# Patient Record
Sex: Female | Born: 1980 | Race: White | Hispanic: No | Marital: Married | Smoking: Never smoker
Health system: Southern US, Community
[De-identification: ages and names within clinical notes are randomized; demographics above are authoritative.]

## PROBLEM LIST (undated history)

## (undated) DIAGNOSIS — O1490 Unspecified pre-eclampsia, unspecified trimester: Secondary | ICD-10-CM

## (undated) DIAGNOSIS — N979 Female infertility, unspecified: Secondary | ICD-10-CM

## (undated) DIAGNOSIS — G43909 Migraine, unspecified, not intractable, without status migrainosus: Secondary | ICD-10-CM

## (undated) DIAGNOSIS — D649 Anemia, unspecified: Secondary | ICD-10-CM

## (undated) HISTORY — DX: Anemia, unspecified: D64.9

## (undated) HISTORY — DX: Unspecified pre-eclampsia, unspecified trimester: O14.90

## (undated) HISTORY — DX: Female infertility, unspecified: N97.9

## (undated) HISTORY — PX: OTHER SURGICAL HISTORY: SHX169

## (undated) HISTORY — DX: Migraine, unspecified, not intractable, without status migrainosus: G43.909

---

## 2012-10-22 DIAGNOSIS — O0901 Supervision of pregnancy with history of infertility, first trimester: Secondary | ICD-10-CM | POA: Insufficient documentation

## 2013-04-08 DIAGNOSIS — O139 Gestational [pregnancy-induced] hypertension without significant proteinuria, unspecified trimester: Secondary | ICD-10-CM | POA: Insufficient documentation

## 2013-08-21 DIAGNOSIS — L210 Seborrhea capitis: Secondary | ICD-10-CM | POA: Insufficient documentation

## 2014-05-13 DIAGNOSIS — Z0110 Encounter for examination of ears and hearing without abnormal findings: Secondary | ICD-10-CM | POA: Insufficient documentation

## 2016-08-28 ENCOUNTER — Ambulatory Visit (HOSPITAL_COMMUNITY)
Admission: EM | Admit: 2016-08-28 | Discharge: 2016-08-28 | Disposition: A | Discharge status: Home / Self Care | Payer: BLUE CROSS/BLUE SHIELD

## 2016-08-28 ENCOUNTER — Encounter (HOSPITAL_COMMUNITY): Payer: Self-pay | Admitting: Emergency Medicine

## 2016-08-28 NOTE — ED Triage Notes (Signed)
Left , middle finger pain.  No known injury.  Accustomed to feeling some, intermittent pain, but today it was different.  Finger is in position of function, if attempted to straighten or flex tightly, finger hurts significantly

## 2016-08-28 NOTE — ED Notes (Signed)
Pt had spontaneous resolution of finger pain/stiffness.  States she will f/u with her PCP instead of being evaluated here since it resolved.

## 2016-08-28 NOTE — ED Notes (Signed)
Bed: UC07 Expected date:  Expected time:  Means of arrival:  Comments: 

## 2016-08-28 NOTE — ED Notes (Signed)
While in triage room, suddenly able to move left middle finger.  This canceled xray and patient has questions as to what happened

## 2017-05-17 ENCOUNTER — Other Ambulatory Visit: Payer: Self-pay

## 2017-05-17 ENCOUNTER — Encounter (HOSPITAL_COMMUNITY): Payer: Self-pay | Admitting: Emergency Medicine

## 2017-05-17 ENCOUNTER — Ambulatory Visit (HOSPITAL_COMMUNITY)
Admission: EM | Admit: 2017-05-17 | Discharge: 2017-05-17 | Disposition: A | Discharge status: Home / Self Care | Payer: BLUE CROSS/BLUE SHIELD | Attending: Urgent Care | Admitting: Urgent Care

## 2017-05-17 DIAGNOSIS — R112 Nausea with vomiting, unspecified: Secondary | ICD-10-CM | POA: Diagnosis not present

## 2017-05-17 DIAGNOSIS — R197 Diarrhea, unspecified: Secondary | ICD-10-CM

## 2017-05-17 DIAGNOSIS — K529 Noninfective gastroenteritis and colitis, unspecified: Secondary | ICD-10-CM

## 2017-05-17 MED ORDER — PROMETHAZINE HCL 25 MG RE SUPP: 25.0000 mg | Freq: Four times a day (QID) | RECTAL | 0 refills | Status: DC | PRN

## 2017-05-17 MED ORDER — ONDANSETRON 8 MG PO TBDP: 8.0000 mg | ORAL_TABLET | Freq: Three times a day (TID) | ORAL | 0 refills | Status: DC | PRN

## 2017-05-17 NOTE — ED Triage Notes (Signed)
Patient reports vomiting and diarrhea for 2 days, low grade temperature, headache, and overall feeling tired.    Child was recently sick with vomiting and diarrhea

## 2017-05-17 NOTE — ED Provider Notes (Signed)
  MRN: [id number] DOB: 1980-06-28  Subjective:   Toni Lara is a 37 y.o. female presenting for 2-day history of nausea with vomiting, diarrhea, subjective fever, fatigue.  Her bowel movements have slowed down, still has nausea and vomiting, yesterday could not tolerate fluids.  She also has mild general belly discomfort.  Her son had similar symptoms prior to patient getting sick and he is now improved.  Patient has previously tried both Phenergan and Zofran.  Phenergan worked better for patient.  She is not currently taking any medications.    Allergies  Allergen Reactions  . Penicillins     Denies past medical history past surgical history.  Objective:   Vitals: BP 127/85 (BP Location: Right Arm)   Pulse 76   Temp 98.4 F (36.9 C) (Oral)   Resp 18   LMP 05/11/2017   SpO2 96%   Physical Exam  Constitutional: She is oriented to person, place, and time. She appears well-developed and well-nourished.  HENT:  Mouth/Throat: Oropharynx is clear and moist.  Cardiovascular: Normal rate, regular rhythm and intact distal pulses. Exam reveals no gallop and no friction rub.  No murmur heard. Pulmonary/Chest: No respiratory distress. She has no wheezes. She has no rales.  Abdominal: Soft. Bowel sounds are normal. She exhibits no distension and no mass. There is no tenderness. There is no rebound and no guarding.  Musculoskeletal: She exhibits no edema.  Neurological: She is alert and oriented to person, place, and time.  Skin: Skin is warm and dry. No rash noted. No erythema. No pallor.  Psychiatric: She has a normal mood and affect.    Assessment and Plan :   Nausea vomiting and diarrhea  Gastroenteritis  Vital signs are very reassuring.  Offered patient both prescription for Zofran and Phenergan.  Patient is to continue her hydration, eat light meals. Return-to-clinic precautions discussed, patient verbalized understanding.    Wallis BambergMani, Brentton Wardlow, PA-C 05/17/17 1203

## 2018-09-07 DIAGNOSIS — M76892 Other specified enthesopathies of left lower limb, excluding foot: Secondary | ICD-10-CM | POA: Insufficient documentation

## 2019-03-18 ENCOUNTER — Other Ambulatory Visit: Payer: Self-pay

## 2019-03-18 ENCOUNTER — Encounter: Payer: Self-pay | Admitting: Certified Nurse Midwife

## 2019-03-18 ENCOUNTER — Ambulatory Visit (INDEPENDENT_AMBULATORY_CARE_PROVIDER_SITE_OTHER): Payer: BC Managed Care – PPO | Admitting: Certified Nurse Midwife

## 2019-03-18 ENCOUNTER — Other Ambulatory Visit (HOSPITAL_COMMUNITY)
Admission: RE | Admit: 2019-03-18 | Discharge: 2019-03-18 | Disposition: A | Discharge status: Home / Self Care | Payer: BLUE CROSS/BLUE SHIELD | Source: Ambulatory Visit | Attending: Certified Nurse Midwife | Admitting: Certified Nurse Midwife

## 2019-03-18 VITALS — BP 120/78 | HR 72 | Temp 97.7°F | Resp 16 | Ht 67.25 in | Wt 144.0 lb

## 2019-03-18 DIAGNOSIS — Z01419 Encounter for gynecological examination (general) (routine) without abnormal findings: Secondary | ICD-10-CM

## 2019-03-18 DIAGNOSIS — Z124 Encounter for screening for malignant neoplasm of cervix: Secondary | ICD-10-CM

## 2019-03-18 DIAGNOSIS — G8929 Other chronic pain: Secondary | ICD-10-CM | POA: Insufficient documentation

## 2019-03-18 DIAGNOSIS — O1490 Unspecified pre-eclampsia, unspecified trimester: Secondary | ICD-10-CM | POA: Insufficient documentation

## 2019-03-18 DIAGNOSIS — H7292 Unspecified perforation of tympanic membrane, left ear: Secondary | ICD-10-CM | POA: Insufficient documentation

## 2019-03-18 NOTE — Progress Notes (Signed)
39 y.o. [id number] Married  Caucasian Fe here to establish gyn care and  for annual exam. Has not had gyn exam since 2016. Periods regular monthly,duration 2 days actual period but some spotting prior to onset. No cramping with periods. Contraception vasectomy. Took Covid vaccine in the 10 days.  Busy at home with twins and now children are in school now, with nanny assistance. Sees Urgent care if needed. Mother and maternal grandmother both had hysterectomy. Sees Dr. Renae Gloss for PCP, recent visit with labs, all normal per patient. No health issues today.  Patient's last menstrual period was 03/13/2019 (exact date).          Sexually active: Yes.    The current method of family planning is vasectomy.    Exercising: Yes.    4-5 times a week Smoker:  no  Review of Systems  Constitutional: Negative.   HENT: Negative.   Eyes: Negative.   Respiratory: Negative.   Cardiovascular: Negative.   Gastrointestinal: Negative.   Genitourinary: Negative.   Musculoskeletal: Negative.   Skin: Negative.   Neurological: Negative.   Endo/Heme/Allergies: Negative.   Psychiatric/Behavioral: Negative.     Health Maintenance: Pap:  2016 neg per patient History of Abnormal Pap: no MMG:  none Self Breast exams: no Colonoscopy:  none BMD:   none TDaP:  2015 Shingles: no Pneumonia: no Hep C and HIV: HIV neg 2014 Labs: no   reports that she has never smoked. She has never used smokeless tobacco. She reports current alcohol use of about 4.0 - 5.0 standard drinks of alcohol per week. She reports that she does not use drugs.  Past Medical History:  Diagnosis Date  . Anemia    in high school  . Infertility, female   . Migraines   . Pre-eclampsia     Past Surgical History:  Procedure Laterality Date  . arm surgery Right   . CESAREAN SECTION      No current outpatient medications on file.   No current facility-administered medications for this visit.    Family History  Problem Relation Age of  Onset  . Hypertension Mother   . Hypertension Father   . Hypertension Maternal Grandmother   . Heart attack Maternal Grandmother   . Heart disease Maternal Grandmother   . Hypertension Maternal Grandfather   . Skin cancer Paternal Grandmother   . Hypertension Paternal Grandmother   . Skin cancer Paternal Grandfather   . Hypertension Paternal Grandfather     ROS:  Pertinent items are noted in HPI.  Otherwise, a comprehensive ROS was negative.  Exam:   BP 120/78   Pulse 72   Temp 97.7 F (36.5 C) (Skin)   Resp 16   Ht 5' 7.25" (1.708 m)   Wt 144 lb (65.3 kg)   LMP 03/13/2019 (Exact Date)   BMI 22.39 kg/m  Height: 5' 7.25" (170.8 cm) Ht Readings from Last 3 Encounters:  03/18/19 5' 7.25" (1.708 m)    General appearance: alert, cooperative and appears stated age Head: Normocephalic, without obvious abnormality, atraumatic Neck: no adenopathy, supple, symmetrical, trachea midline and thyroid normal to inspection and palpation Lungs: clear to auscultation bilaterally Breasts: normal appearance, no masses or tenderness, No nipple retraction or dimpling, No nipple discharge or bleeding, No axillary or supraclavicular adenopathy Heart: regular rate and rhythm Abdomen: soft, non-tender; no masses,  no organomegaly Extremities: extremities normal, atraumatic, no cyanosis or edema, superficial varicosity on right leg, patient aware Skin: Skin color, texture, turgor normal. No rashes or lesions  Lymph nodes: Cervical, supraclavicular, and axillary nodes normal. No abnormal inguinal nodes palpated Neurologic: Grossly normal   Pelvic: External genitalia:  no lesions, normal female              Urethra:  normal appearing urethra with no masses, tenderness or lesions              Bartholin's and Skene's: normal                 Vagina: normal appearing vagina with normal color and discharge, no lesions              Cervix: multiparous appearance, no cervical motion tenderness and no  lesions              Pap taken: Yes.   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 Given information to schedule mammogram. Questions addressed.  Continue yearly exam with PCP as indicated  Printed information on varicose veins given  Pap smear: yes   counseled on breast self exam, mammography screening, feminine hygiene, adequate intake of calcium and vitamin D, diet and exercise  return annually or prn  An After Visit Summary was printed and given to the patient.

## 2019-03-18 NOTE — Patient Instructions (Addendum)
EXERCISE AND DIET:  We recommended that you start or continue a regular exercise program for good health. Regular exercise means any activity that makes your heart beat faster and makes you sweat.  We recommend exercising at least 30 minutes per day at least 3 days a week, preferably 4 or 5.  We also recommend a diet low in fat and sugar.  Inactivity, poor dietary choices and obesity can cause diabetes, heart attack, stroke, and kidney damage, among others.    ALCOHOL AND SMOKING:  Women should limit their alcohol intake to no more than 7 drinks/beers/glasses of wine (combined, not each!) per week. Moderation of alcohol intake to this level decreases your risk of breast cancer and liver damage. And of course, no recreational drugs are part of a healthy lifestyle.  And absolutely no smoking or even second hand smoke. Most people know smoking can cause heart and lung diseases, but did you know it also contributes to weakening of your bones? Aging of your skin?  Yellowing of your teeth and nails?  CALCIUM AND VITAMIN D:  Adequate intake of calcium and Vitamin D are recommended.  The recommendations for exact amounts of these supplements seem to change often, but generally speaking 600 mg of calcium (either carbonate or citrate) and 800 units of Vitamin D per day seems prudent. Certain women may benefit from higher intake of Vitamin D.  If you are among these women, your doctor will have told you during your visit.    PAP SMEARS:  Pap smears, to check for cervical cancer or precancers,  have traditionally been done yearly, although recent scientific advances have shown that most women can have pap smears less often.  However, every woman still should have a physical exam from her gynecologist every year. It will include a breast check, inspection of the vulva and vagina to check for abnormal growths or skin changes, a visual exam of the cervix, and then an exam to evaluate the size and shape of the uterus and  ovaries.  And after 40 years of age, a rectal exam is indicated to check for rectal cancers. We will also provide age appropriate advice regarding health maintenance, like when you should have certain vaccines, screening for sexually transmitted diseases, bone density testing, colonoscopy, mammograms, etc.   MAMMOGRAMS:  All women over 40 years old should have a yearly mammogram. Many facilities now offer a "3D" mammogram, which may cost around $50 extra out of pocket. If possible,  we recommend you accept the option to have the 3D mammogram performed.  It both reduces the number of women who will be called back for extra views which then turn out to be normal, and it is better than the routine mammogram at detecting truly abnormal areas.    COLONOSCOPY:  Colonoscopy to screen for colon cancer is recommended for all women at age 50.  We know, you hate the idea of the prep.  We agree, BUT, having colon cancer and not knowing it is worse!!  Colon cancer so often starts as a polyp that can be seen and removed at colonscopy, which can quite literally save your life!  And if your first colonoscopy is normal and you have no family history of colon cancer, most women don't have to have it again for 10 years.  Once every ten years, you can do something that may end up saving your life, right?  We will be happy to help you get it scheduled when you are ready.    Be sure to check your insurance coverage so you understand how much it will cost.  It may be covered as a preventative service at no cost, but you should check your particular policy.      Varicose Veins Varicose veins are veins that have become enlarged, bulged, and twisted. They most often appear in the legs. What are the causes? This condition is caused by damage to the valves in the vein. These valves help blood return to your heart. When they are damaged and they stop working properly, blood may flow backward and back up in the veins near the skin,  causing the veins to get larger and appear twisted. The condition can result from any issue that causes blood to back up, like pregnancy, prolonged standing, or obesity. What increases the risk? This condition is more likely to develop in people who are:  On their feet a lot.  Pregnant.  Overweight. What are the signs or symptoms? Symptoms of this condition include:  Bulging, twisted, and bluish veins.  A feeling of heaviness. This may be worse at the end of the day.  Leg pain. This may be worse at the end of the day.  Swelling in the leg.  Changes in skin color over the veins. How is this diagnosed? This condition may be diagnosed based on your symptoms, a physical exam, and an ultrasound test. How is this treated? Treatment for this condition may involve:  Avoiding sitting or standing in one position for long periods of time.  Wearing compression stockings. These stockings help to prevent blood clots and reduce swelling in the legs.  Raising (elevating) the legs when resting.  Losing weight.  Exercising regularly. If you have persistent symptoms or want to improve the way your varicose veins look, you may choose to have a procedure to close the varicose veins off or to remove them. Treatments to close off the veins include:  Sclerotherapy. In this treatment, a solution is injected into a vein to close it off.  Laser treatment. In this treatment, the vein is heated with a laser to close it off.  Radiofrequency vein ablation. In this treatment, an electrical current produced by radio waves is used to close off the vein. Treatments to remove the veins include:  Phlebectomy. In this treatment, the veins are removed through small incisions made over the veins.  Vein ligation and stripping. In this treatment, incisions are made over the veins. The veins are then removed after being tied (ligated) with stitches (sutures). Follow these instructions at  home: Activity  Walk as much as possible. Walking increases blood flow. This helps blood return to the heart and takes pressure off your veins. It also increases your cardiovascular strength.  Follow your health care provider's instructions about exercising.  Do not stand or sit in one position for a long period of time.  Do not sit with your legs crossed.  Rest with your legs raised during the day. General instructions   Follow any diet instructions given to you by your health care provider.  Wear compression stockings as directed by your health care provider. Do not wear other kinds of tight clothing around your legs, pelvis, or waist.  Elevate your legs at night to above the level of your heart.  If you get a cut in the skin over the varicose vein and the vein bleeds: ? Lie down with your leg raised. ? Apply firm pressure to the cut with a clean cloth until the bleeding  stops. ? Place a bandage (dressing) on the cut. Contact a health care provider if:  The skin around your varicose veins starts to break down.  You have pain, redness, tenderness, or hard swelling over a vein.  You are uncomfortable because of pain.  You get a cut in the skin over a varicose vein and it will not stop bleeding. Summary  Varicose veins are veins that have become enlarged, bulged, and twisted. They most often appear in the legs.  This condition is caused by damage to the valves in the vein. These valves help blood return to your heart.  Treatment for this condition includes frequent movements, wearing compression stockings, losing weight, and exercising regularly. In some cases, procedures are done to close off or remove the veins.  Treatment for this condition may include wearing compression stockings, elevating the legs, losing weight, and engaging in regular activity. In some cases, procedures are done to close off or remove the veins. This information is not intended to replace advice  given to you by your health care provider. Make sure you discuss any questions you have with your health care provider. Document Revised: 03/15/2018 Document Reviewed: 02/10/2016 Elsevier Patient Education  2020 [employer].

## 2019-03-20 LAB — CYTOLOGY - PAP
Comment: NEGATIVE
Diagnosis: NEGATIVE
High risk HPV: NEGATIVE

## 2019-04-19 ENCOUNTER — Encounter: Payer: Self-pay | Admitting: Certified Nurse Midwife

## 2019-12-12 ENCOUNTER — Ambulatory Visit: Payer: BLUE CROSS/BLUE SHIELD | Attending: Internal Medicine

## 2019-12-12 DIAGNOSIS — Z23 Encounter for immunization: Secondary | ICD-10-CM

## 2019-12-12 NOTE — Progress Notes (Signed)
   Covid-19 Vaccination Clinic  Name:  Toni Lara    MRN: [id number] DOB: 02/16/1980  12/12/2019  Toni Lara was observed post Covid-19 immunization for 30 minutes based on pre-vaccination screening without incident. She was provided with Vaccine Information Sheet and instruction to access the V-Safe system.   Toni Lara was instructed to call 911 with any severe reactions post vaccine: Marland Kitchen Difficulty breathing  . Swelling of face and throat  . A fast heartbeat  . A bad rash all over body  . Dizziness and weakness

## 2020-01-03 DIAGNOSIS — Z1231 Encounter for screening mammogram for malignant neoplasm of breast: Secondary | ICD-10-CM

## 2020-01-06 ENCOUNTER — Other Ambulatory Visit: Payer: Self-pay | Admitting: Certified Nurse Midwife

## 2020-01-06 DIAGNOSIS — Z1231 Encounter for screening mammogram for malignant neoplasm of breast: Secondary | ICD-10-CM

## 2020-01-30 ENCOUNTER — Ambulatory Visit
Admission: RE | Admit: 2020-01-30 | Discharge: 2020-01-30 | Disposition: A | Discharge status: Home / Self Care | Payer: BC Managed Care – PPO | Source: Ambulatory Visit

## 2020-01-30 ENCOUNTER — Other Ambulatory Visit: Payer: Self-pay

## 2020-01-30 DIAGNOSIS — Z1231 Encounter for screening mammogram for malignant neoplasm of breast: Secondary | ICD-10-CM

## 2020-02-04 ENCOUNTER — Other Ambulatory Visit: Payer: Self-pay | Admitting: Obstetrics and Gynecology

## 2020-02-04 ENCOUNTER — Other Ambulatory Visit: Payer: Self-pay | Admitting: Cardiology

## 2020-02-04 DIAGNOSIS — R928 Other abnormal and inconclusive findings on diagnostic imaging of breast: Secondary | ICD-10-CM

## 2020-02-05 ENCOUNTER — Ambulatory Visit: Payer: Self-pay | Admitting: Internal Medicine

## 2020-02-14 ENCOUNTER — Ambulatory Visit
Admission: RE | Admit: 2020-02-14 | Discharge: 2020-02-14 | Disposition: A | Discharge status: Home / Self Care | Payer: BC Managed Care – PPO | Source: Ambulatory Visit | Attending: Obstetrics and Gynecology | Admitting: Obstetrics and Gynecology

## 2020-02-14 ENCOUNTER — Other Ambulatory Visit: Payer: Self-pay | Admitting: Obstetrics and Gynecology

## 2020-02-14 ENCOUNTER — Other Ambulatory Visit: Payer: Self-pay

## 2020-02-14 DIAGNOSIS — R928 Other abnormal and inconclusive findings on diagnostic imaging of breast: Secondary | ICD-10-CM

## 2020-02-17 ENCOUNTER — Ambulatory Visit: Payer: Self-pay | Admitting: Internal Medicine

## 2020-02-28 ENCOUNTER — Inpatient Hospital Stay: Admission: RE | Admit: 2020-02-28 | Payer: BC Managed Care – PPO | Source: Ambulatory Visit

## 2020-03-10 ENCOUNTER — Other Ambulatory Visit: Payer: Self-pay

## 2020-03-10 ENCOUNTER — Ambulatory Visit
Admission: RE | Admit: 2020-03-10 | Discharge: 2020-03-10 | Disposition: A | Discharge status: Home / Self Care | Payer: BC Managed Care – PPO | Source: Ambulatory Visit | Attending: Obstetrics and Gynecology | Admitting: Obstetrics and Gynecology

## 2020-03-10 ENCOUNTER — Other Ambulatory Visit (HOSPITAL_COMMUNITY): Payer: Self-pay | Admitting: Diagnostic Radiology

## 2020-03-10 DIAGNOSIS — R928 Other abnormal and inconclusive findings on diagnostic imaging of breast: Secondary | ICD-10-CM

## 2020-03-18 ENCOUNTER — Ambulatory Visit: Payer: BC Managed Care – PPO | Admitting: Certified Nurse Midwife

## 2022-07-19 IMAGING — MG MM BREAST LOCALIZATION CLIP
4 series · 4 of 12 positions shown · non-contrast
Comparison: Previous exam(s).

CLINICAL DATA: Evaluate COIL clip placement following stereotactic
guided biopsy of OUTER LEFT breast distortion.

EXAM:
DIAGNOSTIC LEFT MAMMOGRAM POST STEREOTACTIC BIOPSY

[L CC synth-2D]
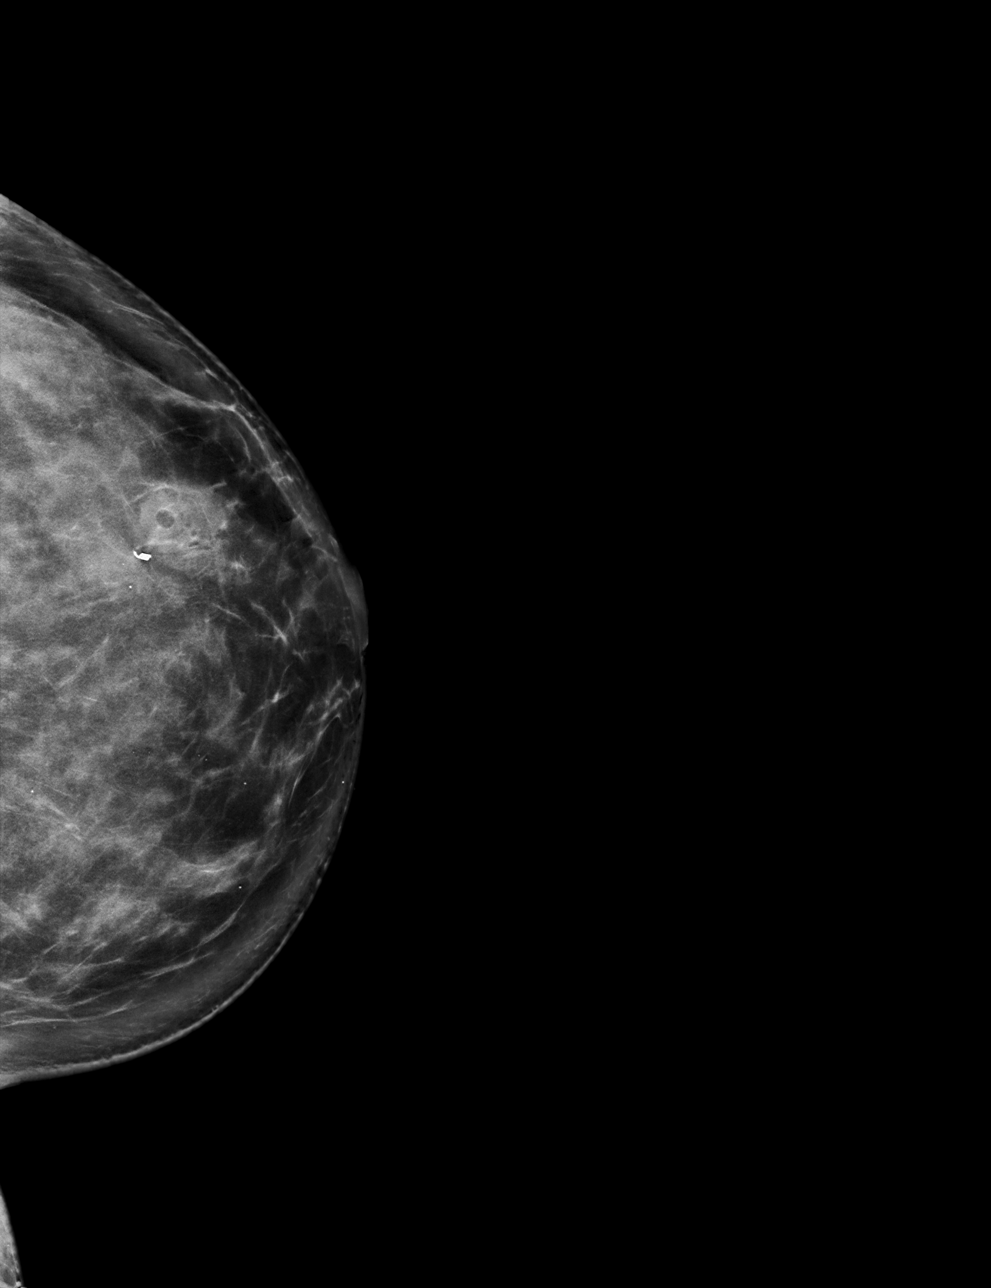

[L ML synth-2D]
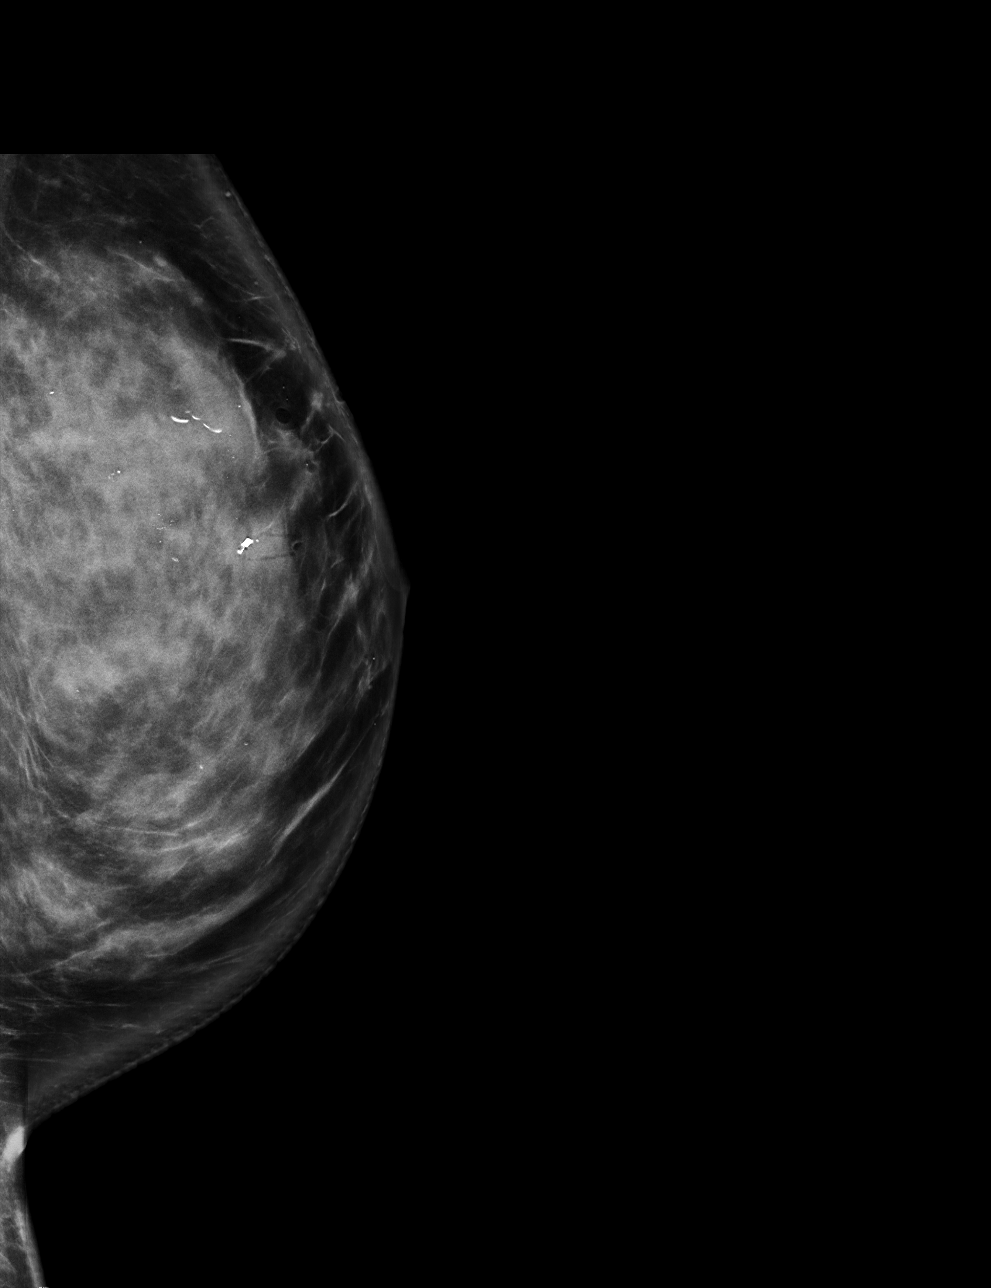

[L CC tomo · tomo slice 43/86.0]
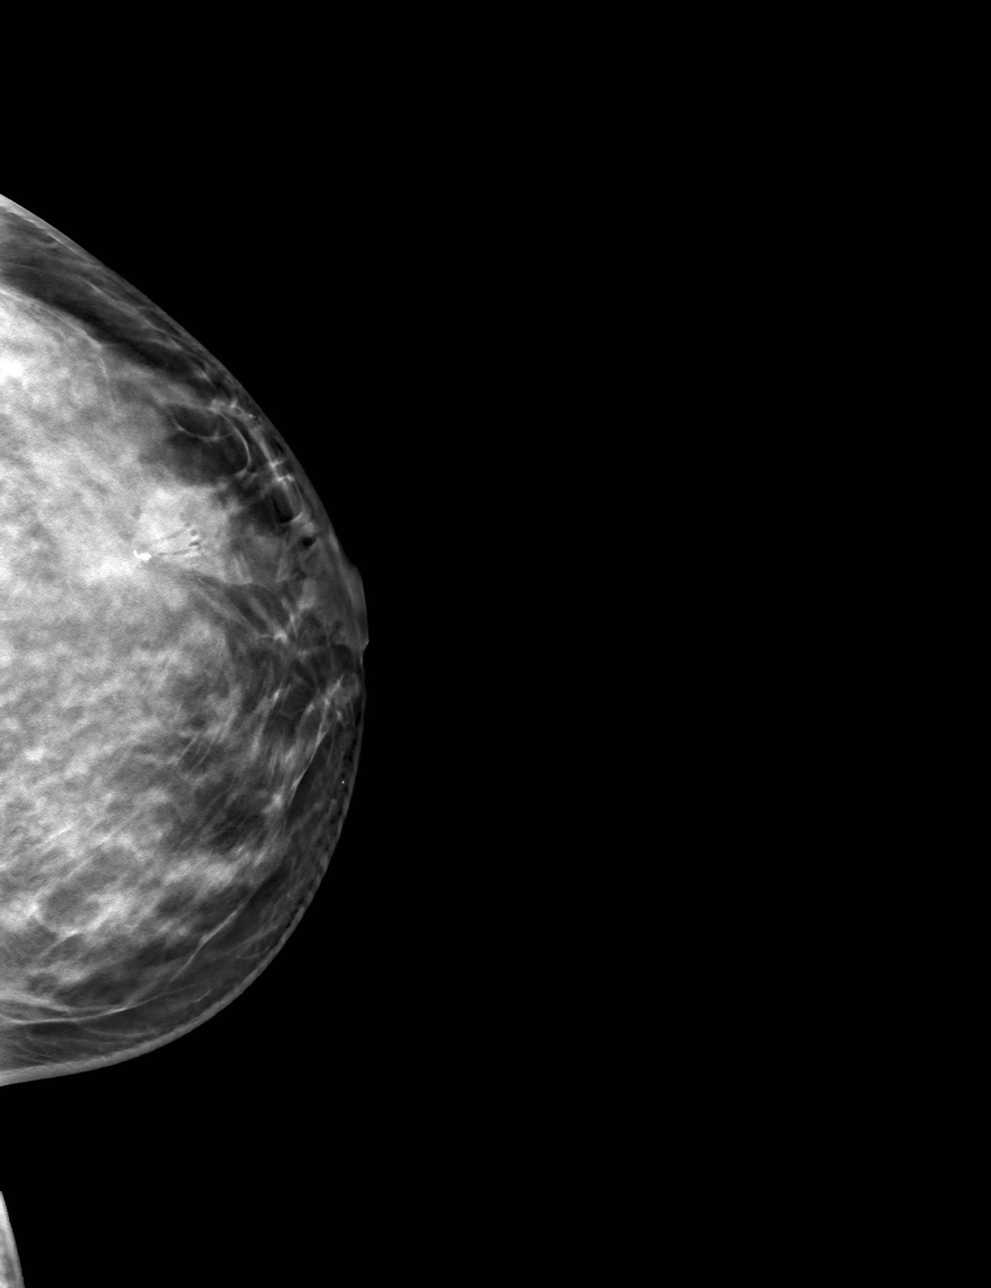

[L ML tomo · tomo slice 43/85.0]
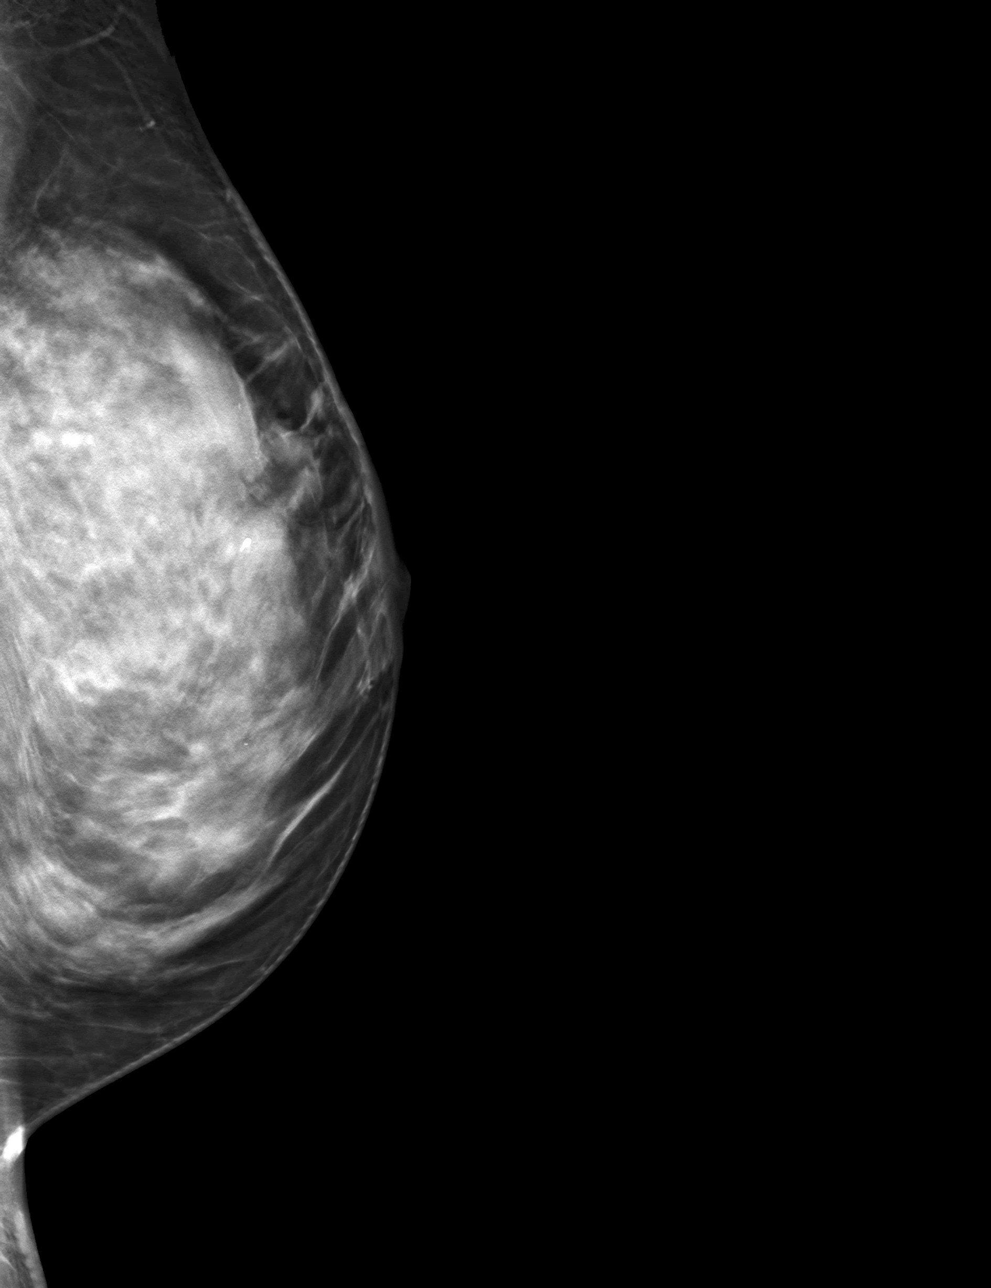

[4 of 12 positions shown; findings below may reference images not displayed]

FINDINGS: Mammographic images were obtained following stereotactic guided
biopsy of OUTER LEFT breast distortion. The COIL biopsy marking clip
is in expected position at the site of biopsy.
IMPRESSION: Appropriate positioning of the COIL shaped biopsy marking clip at
the site of biopsy in the OUTER LEFT breast.

Final Assessment: Post Procedure Mammograms for Marker Placement
